# Patient Record
Sex: Male | Born: 1990 | Race: Black or African American | Hispanic: No | Marital: Single
Health system: Midwestern US, Community
[De-identification: ages and names within clinical notes are randomized; demographics above are authoritative.]

## PROBLEM LIST (undated history)

## (undated) DIAGNOSIS — M25571 Pain in right ankle and joints of right foot: Secondary | ICD-10-CM

## (undated) HISTORY — PX: EYE SURGERY: SHX253

---

## 2016-07-25 ENCOUNTER — Emergency Department (HOSPITAL_COMMUNITY)
Admission: EM | Admit: 2016-07-25 | Discharge: 2016-07-25 | Disposition: A | Discharge status: Home / Self Care | Payer: Self-pay | Attending: Emergency Medicine | Admitting: Emergency Medicine

## 2016-07-25 ENCOUNTER — Encounter (HOSPITAL_COMMUNITY): Payer: Self-pay | Admitting: Emergency Medicine

## 2016-07-25 ENCOUNTER — Emergency Department (HOSPITAL_COMMUNITY): Payer: Self-pay

## 2016-07-25 DIAGNOSIS — R55 Syncope and collapse: Secondary | ICD-10-CM | POA: Insufficient documentation

## 2016-07-25 LAB — BASIC METABOLIC PANEL
Anion gap: 7 (ref 5–15)
BUN: 6 mg/dL (ref 6–20)
CALCIUM: 9.4 mg/dL (ref 8.9–10.3)
CO2: 24 mmol/L (ref 22–32)
Chloride: 108 mmol/L (ref 101–111)
Creatinine, Ser: 1.04 mg/dL (ref 0.61–1.24)
GFR calc Af Amer: >60 mL/min (ref >60)
Glucose, Bld: 96 mg/dL (ref 65–99)
POTASSIUM: 4.3 mmol/L (ref 3.5–5.1)
SODIUM: 139 mmol/L (ref 135–145)

## 2016-07-25 LAB — CBC WITH DIFFERENTIAL/PLATELET
BASOS ABS: 0 10*3/uL (ref 0.0–0.1)
Basophils Relative: 0 %
EOS ABS: 0.1 10*3/uL (ref 0.0–0.7)
EOS PCT: 1 %
HCT: 43.9 % (ref 39.0–52.0)
Hemoglobin: 14.3 g/dL (ref 13.0–17.0)
Lymphocytes Relative: 20 %
Lymphs Abs: 1.6 10*3/uL (ref 0.7–4.0)
MCH: 29.6 pg (ref 26.0–34.0)
MCHC: 32.6 g/dL (ref 30.0–36.0)
MCV: 90.9 fL (ref 78.0–100.0)
MONO ABS: 0.7 10*3/uL (ref 0.1–1.0)
Monocytes Relative: 9 %
Neutro Abs: 5.5 10*3/uL (ref 1.7–7.7)
Neutrophils Relative %: 70 %
PLATELETS: 276 10*3/uL (ref 150–400)
RBC: 4.83 MIL/uL (ref 4.22–5.81)
RDW: 12.8 % (ref 11.5–15.5)
WBC: 7.9 10*3/uL (ref 4.0–10.5)

## 2016-07-25 NOTE — ED Provider Notes (Signed)
[facility]-EMERGENCY DEPT Provider Note   CSN: [id number] Arrival date & time: 07/25/16  1100     History   Chief Complaint Chief Complaint  Patient presents with  . Loss of Consciousness    HPI Mathew Potts is a 25 y.o. male.  HPI Patient presents emergency department after having a syncopal episode at work today.  He had a small nosebleed prior to that but it stopped on its own.  Did have a headache when he awoke this morning and has had headaches off and on for the last several weeks.  Is currently on no medication.  Did have preceding dizziness prior to passing out. History reviewed. No pertinent past medical history.  There are no active problems to display for this patient.   Past Surgical History:  Procedure Laterality Date  . 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 Authorizing Provider  diphenhydrAMINE (BENADRYL) 25 MG tablet Take 25 mg by mouth every 6 (six) hours as needed for allergies.   Yes Historical Provider, MD  guaifenesin (ROBITUSSIN) 100 MG/5ML syrup Take 200 mg by mouth 3 (three) times daily as needed for cough.   Yes Historical Provider, MD    Family History No family history on file.  Social History Social History  Substance Use Topics  . Smoking status: Never Smoker  . Smokeless tobacco: Never Used  . Alcohol use Yes     Comment: occasionally      Allergies   Review of patient's allergies indicates no known allergies.   Review of Systems Review of Systems  All other systems reviewed and are negative Physical Exam Updated Vital Signs BP 140/88   Pulse 67   Temp 99.4 F (37.4 C) (Oral)   Resp 15   Ht 5\' 7"  (1.702 m)   Wt 238 lb (108 kg)   SpO2 99%   BMI 37.28 kg/m   Physical Exam  Physical Exam  Nursing note and vitals reviewed. Constitutional: He is oriented to person, place, and time. He appears well-developed  and well-nourished. No distress.  HENT:  Head: Normocephalic and atraumatic.  Eyes: Pupils are equal, round, and reactive to light.  Neck: Normal range of motion.  Cardiovascular: Normal rate and intact distal pulses.   Pulmonary/Chest: No respiratory distress.  Abdominal: Normal appearance. He exhibits no distension.  Musculoskeletal: Normal range of motion.  Neurological: He is alert and oriented to person, place, and time. No cranial nerve deficit.  Skin: Skin is warm and dry. No rash noted.  Psychiatric: He has a normal mood and affect. His behavior is normal.   ED Treatments / Results  Labs (all labs ordered are listed, but only abnormal results are displayed) Labs Reviewed  CBC WITH DIFFERENTIAL/PLATELET  BASIC METABOLIC PANEL    EKG  EKG Interpretation None       Radiology Ct Head Wo Contrast  Result Date: 07/25/2016 CLINICAL DATA:  Pt fell today and hit the back left side of head, pt has been having a headache for the last few days on the right side, no other head injury or head trauma. EXAM: CT HEAD WITHOUT CONTRAST TECHNIQUE: Contiguous axial images were obtained from the base of the skull through the vertex without intravenous contrast. COMPARISON:  None. FINDINGS: Brain: There is no evidence of acute intracranial hemorrhage, mass lesion, brain edema or extra-axial fluid collection.  The ventricles and subarachnoid spaces are appropriately sized for age. There is no CT evidence of acute cortical infarction. Vascular: No hyperdense vessel or unexpected calcification. Skull: Negative for fracture or focal lesion. Sinuses/Orbits: The visualized paranasal sinuses and mastoid air cells are clear. No orbital abnormalities are seen. 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 IMPRESSION: Negative noncontrast head CT. Electronically Signed   By: Carey BullocksWilliam  Veazey M.D.   On: 07/25/2016 12:16    Procedures Procedures (including critical care time)  Medications  Ordered in ED Medications - No data to display   Initial Impression / Assessment and Plan / ED Course  I have reviewed the triage vital signs and the nursing notes.  Pertinent labs & imaging results that were available during my care of the patient were reviewed by me and considered in my medical decision making (see chart for details).  Clinical Course      Final Clinical Impressions(s) / ED Diagnoses   Final diagnoses:  Vasovagal syncope    New Prescriptions New Prescriptions   No medications on file     Nelva Nayobert Charlesa Ehle, MD 07/25/16 1338

## 2016-07-25 NOTE — ED Triage Notes (Signed)
Per EMS pt had a syncopal episode after a nosebleed, Pt states he was dizzy this morning and did not eat breakfast, just drank coffee and ate some candy. Pt had a headache this morning and started feeling dizzy, then noticed he had a nosebleed, felt more dizzy, passed out, and fell backwards. Witness says pt hit head on concrete.

## 2016-07-25 NOTE — ED Notes (Signed)
Pt returned from CT °

## 2016-07-31 ENCOUNTER — Encounter (HOSPITAL_COMMUNITY): Payer: Self-pay | Admitting: *Deleted

## 2016-07-31 ENCOUNTER — Emergency Department (HOSPITAL_COMMUNITY)
Admission: EM | Admit: 2016-07-31 | Discharge: 2016-07-31 | Disposition: A | Discharge status: Home / Self Care | Payer: Self-pay | Attending: Emergency Medicine | Admitting: Emergency Medicine

## 2016-07-31 ENCOUNTER — Emergency Department (HOSPITAL_COMMUNITY): Payer: Self-pay

## 2016-07-31 DIAGNOSIS — R519 Headache, unspecified: Secondary | ICD-10-CM

## 2016-07-31 DIAGNOSIS — R51 Headache: Secondary | ICD-10-CM | POA: Insufficient documentation

## 2016-07-31 MED ORDER — FEXOFENADINE-PSEUDOEPHED ER 60-120 MG PO TB12: 1.0000 | ORAL_TABLET | Freq: Two times a day (BID) | ORAL | 0 refills | Status: AC

## 2016-07-31 MED ORDER — KETOROLAC TROMETHAMINE 60 MG/2ML IM SOLN
60.0000 mg | Freq: Once | INTRAMUSCULAR | Status: AC
  Administered 2016-07-31: 60 mg via INTRAMUSCULAR
  Filled 2016-07-31: qty 2

## 2016-07-31 NOTE — ED Triage Notes (Addendum)
Patient presents to ED with c/o intermittent headache since his last ED visit on 9/29.  Patient was seen here on 9/29 and dx with vasovagal syncope.  Patient states pain is currently behind right eye and radiating into right temple.  Patient rates pain 2/10 currently and characterizes it as throbbing.  Patient has nasal congestion as well as a non-productive cough "for a long time."  Patient denies N/V/D,  fever and changes in vision.  Patient states he has had brief episodes of dizziness over the last week but is not currently dizzy.  Patient has treated nasal congestion with saline spray in the past, but when he moved to [place] last month, he left his spray behind.  Patient states he has been under a lot of stress lately because of a strained romantic relationship and his recent relocation.  Patient is ambulatory in triage with a steady gait and is in no distress.  Patient's only pertinent PMH is bilateral corneal transplant surgery at the age of 25.

## 2016-07-31 NOTE — ED Notes (Signed)
Bed: WA26 Expected date:  Expected time:  Means of arrival:  Comments: 

## 2016-07-31 NOTE — ED Provider Notes (Signed)
[facility]-EMERGENCY DEPT Provider Note   CSN: [id number] Arrival date & time: 07/31/16  1141  By signing my name below, I, Soijett Blue, attest that this documentation has been prepared under the direction and in the presence of Rolan Bucco, MD. Electronically Signed: Soijett Blue, ED Scribe. 07/31/16. 12:20 PM.   History   Chief Complaint Chief Complaint  Patient presents with  . Headache    HPI Mathew Potts is a 25 y.o. male who presents to the Emergency Department complaining of 2/10, waxing and waning, throbbing, right sided HA onset 1 month worsening today. Pt denies having headaches in the past. Pt states that he recently fell last week and struck his head, and he notes that he was evaluated in the ED. He states that he has tried ASA and anti-histamine with no relief of his symptoms. Pt denies taking an anti-inflammatory for relief of his HA. He denies nausea, vomiting, vision changes, fever, neck pain, numbness, tingling, weakness, nasal congestion, rhinorrhea, CP, SOB, abdominal pain, hematuria, constipation, diarrhea, photophobia, and any other symptoms. Denies having a PCP.   The history is provided by the patient. No language interpreter was used.    History reviewed. No pertinent past medical history.  There are no active problems to display for this patient.   Past Surgical History:  Procedure Laterality Date  . 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 Authorizing Provider  diphenhydrAMINE (BENADRYL) 25 MG tablet Take 25 mg by mouth every 6 (six) hours as needed for allergies.    Historical Provider, MD  fexofenadine-pseudoephedrine (ALLEGRA-D) 60-120 MG 12 hr tablet Take 1 tablet by mouth every 12 (twelve) hours. 07/31/16   Rolan Bucco, MD  guaifenesin (ROBITUSSIN) 100 MG/5ML syrup Take 200 mg by mouth 3 (three) times daily as needed for cough.    Historical  Provider, MD    Family History No family history on file.  Social History Social History  Substance Use Topics  . Smoking status: Never Smoker  . Smokeless tobacco: Never Used  . Alcohol use Yes     Comment: occasionally      Allergies   Review of patient's allergies indicates no known allergies.   Review of Systems Review of Systems  Constitutional: Negative for chills, diaphoresis, fatigue and fever.  HENT: Negative for congestion, rhinorrhea and sneezing.   Eyes: Negative.  Negative for photophobia and visual disturbance.  Respiratory: Negative for cough, chest tightness and shortness of breath.   Cardiovascular: Negative for chest pain and leg swelling.  Gastrointestinal: Negative for abdominal pain, blood in stool, constipation, diarrhea, nausea and vomiting.  Genitourinary: Negative for difficulty urinating, flank pain, frequency and hematuria.  Musculoskeletal: Negative for arthralgias, back pain and neck pain.  Skin: Negative for rash.  Neurological: Positive for headaches. Negative for dizziness, speech difficulty, weakness and numbness.       No tingling     Physical Exam Updated Vital Signs BP 142/76 (BP Location: Left Arm)   Pulse 80   Temp 98.1 F (36.7 C) (Oral)   Resp 17   Ht 5\' 7"  (1.702 m)   Wt 241 lb (109.3 kg)   SpO2 97%   BMI 37.75 kg/m   Physical Exam  Constitutional: He is oriented to person, place, and time. He appears well-developed and well-nourished.  HENT:  Head: Normocephalic and atraumatic.  Nose: Right sinus  exhibits no maxillary sinus tenderness and no frontal sinus tenderness. Left sinus exhibits no maxillary sinus tenderness and no frontal sinus tenderness.  Eyes: Pupils are equal, round, and reactive to light.  Neck: Normal range of motion. Neck supple.  Cardiovascular: Normal rate, regular rhythm and normal heart sounds.   Pulmonary/Chest: Effort normal and breath sounds normal. No respiratory distress. He has no wheezes. He  has no rales. He exhibits no tenderness.  Abdominal: Soft. Bowel sounds are normal. There is no tenderness. There is no rebound and no guarding.  Musculoskeletal: Normal range of motion. He exhibits no edema.  Lymphadenopathy:    He has no cervical adenopathy.  Neurological: He is alert and oriented to person, place, and time. He has normal strength. No cranial nerve deficit or sensory deficit. Coordination and gait normal.  Mental Status:  Alert, oriented, thought content appropriate, able to give a coherent history. Speech fluent without evidence of aphasia. Able to follow 2 step commands without difficulty.  Cranial Nerves:  II:  Peripheral visual fields grossly normal, pupils equal, round, reactive to light III,IV, VI: ptosis not present, extra-ocular motions intact bilaterally  V,VII: smile symmetric, facial light touch sensation equal VIII: hearing grossly normal to voice  X: uvula elevates symmetrically  XI: bilateral shoulder shrug symmetric and strong XII: midline tongue extension without fassiculations Motor:  Normal tone. 5/5 in upper and lower extremities bilaterally including strong and equal grip strength and dorsiflexion/plantar flexion Deep Tendon Reflexes: 2+ and symmetric in the biceps and patella Cerebellar: normal finger-to-nose with bilateral upper extremities Gait: normal gait and balance CV: distal pulses palpable throughout   Skin: Skin is warm and dry. No rash noted.  Psychiatric: He has a normal mood and affect.  Nursing note and vitals reviewed.    ED Treatments / Results  DIAGNOSTIC STUDIES: Oxygen Saturation is 97% on RA, nl by my interpretation.    COORDINATION OF CARE: 12:17 PM Discussed treatment plan with pt at bedside which includes CT head, toradol injection, and pt agreed to plan.   Radiology Ct Head Wo Contrast  Result Date: 07/31/2016 CLINICAL DATA:  25 year old male with intermittent headaches and a recent admission for vasovagal syncope.  EXAM: CT HEAD WITHOUT CONTRAST TECHNIQUE: Contiguous axial images were obtained from the base of the skull through the vertex without intravenous contrast. COMPARISON:  Prior CT scan of the head 07/25/2016 FINDINGS: Brain: No evidence of acute infarction, hemorrhage, hydrocephalus, extra-axial collection or mass lesion/mass effect. Vascular: No hyperdense vessel or unexpected calcification. Skull: Normal. Negative for fracture or focal lesion. Sinuses/Orbits: No acute finding. 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 IMPRESSION: Negative head CT. Electronically Signed   By: Malachy Moan M.D.   On: 07/31/2016 12:59    Procedures Procedures (including critical care time)  Medications Ordered in ED Medications  ketorolac (TORADOL) injection 60 mg (60 mg Intramuscular Given 07/31/16 1227)     Initial Impression / Assessment and Plan / ED Course  I have reviewed the triage vital signs and the nursing notes.  Pertinent imaging results that were available during my care of the patient were reviewed by me and considered in my medical decision making (see chart for details).  Clinical Course    Patient presents with a headache. He is neurologically intact. Given that they've been going on for over a month consistently, I did obtain a head CT which was negative. There is no mass. He doesn't have any suggestions of subarachnoid  hemorrhage or meningitis. His headache is mild at this point. He was given a shot of Toradol and is essentially gone. This could be related to his allergies with sinus congestion. There is no evidence of sinusitis. He was given a prescription for a Allegra-D. He was encouraged to establish care with a PCP or return here as needed for any worsening symptoms.  Final Clinical Impressions(s) / ED Diagnoses   Final diagnoses:  Headache disorder    New Prescriptions New Prescriptions   FEXOFENADINE-PSEUDOEPHEDRINE (ALLEGRA-D) 60-120 MG  12 HR TABLET    Take 1 tablet by mouth every 12 (twelve) hours.    I personally performed the services described in this documentation, which was scribed in my presence.  The recorded information has been reviewed and considered.     Rolan BuccoMelanie Shalandra Leu, MD 07/31/16 [phone]

## 2018-05-17 IMAGING — CT CT HEAD W/O CM
3 of 4 series · 18 of 47 positions shown, 21 images · non-contrast
Comparison: None.

CLINICAL DATA: Pt fell today and hit the back left side of head, pt
has been having a headache for the last few days on the right side,
no other head injury or head trauma.

EXAM:
CT HEAD WITHOUT CONTRAST
TECHNIQUE: Contiguous axial images were obtained from the base of the skull
through the vertex without intravenous contrast.

[Series 201: head w/o, idose (1) · axial · non-contrast · 0.42mm/px · z∈[+941,+1066]mm · 12 of 31 slices shown, 15 images]
[im 3/31  brain]
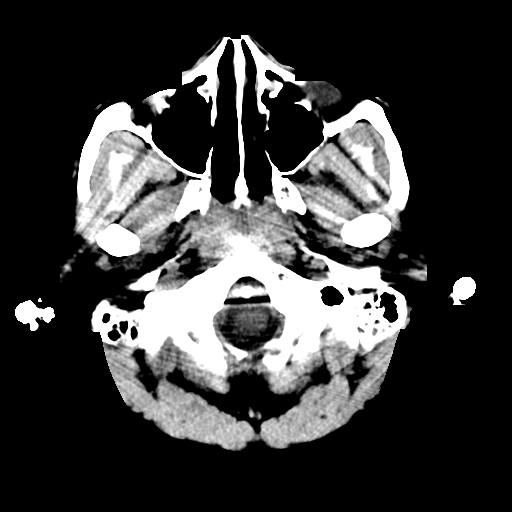
[im 3/31  bone]
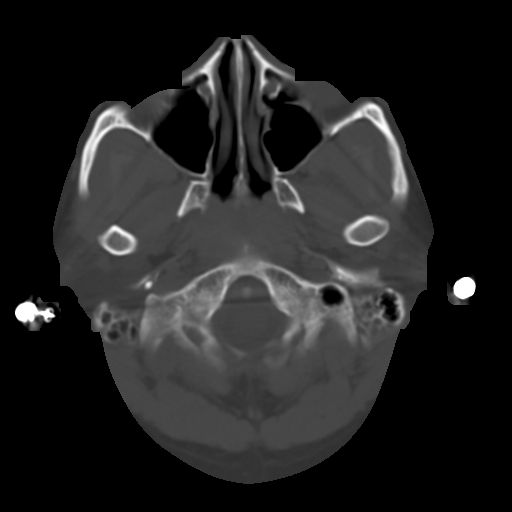
[im 5/31  brain]
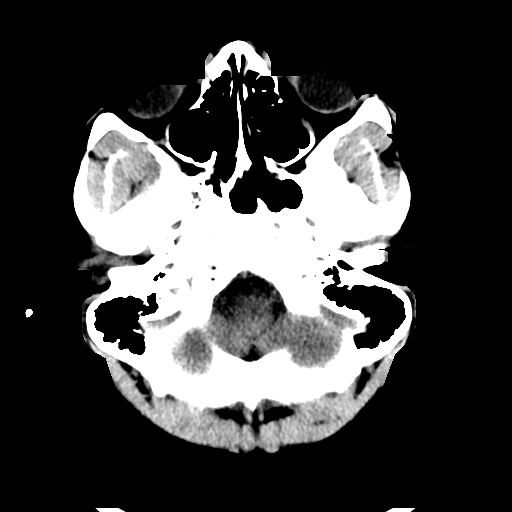
[im 7/31  brain]
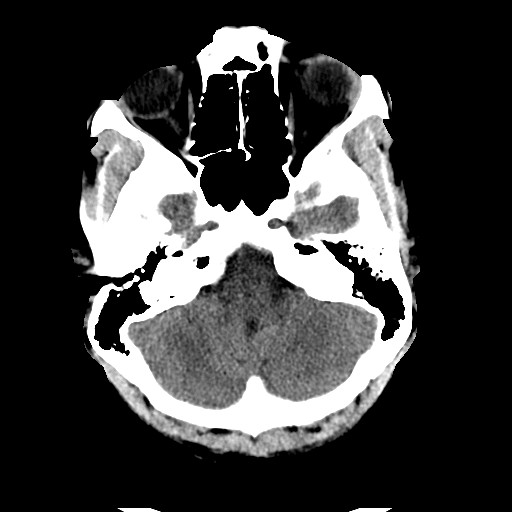
[im 9/31  brain]
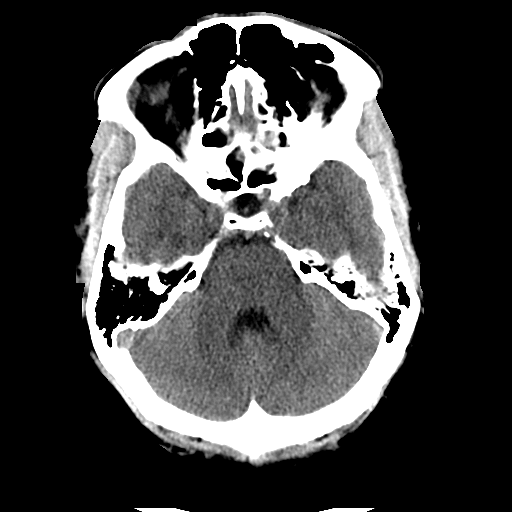
[im 11/31  brain]
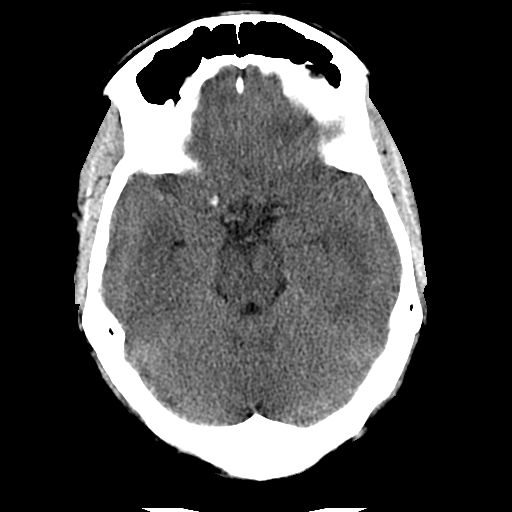
[im 11/31  bone]
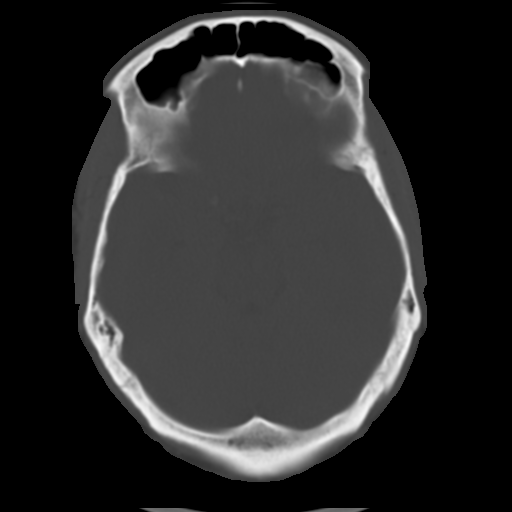
[im 13/31  brain]
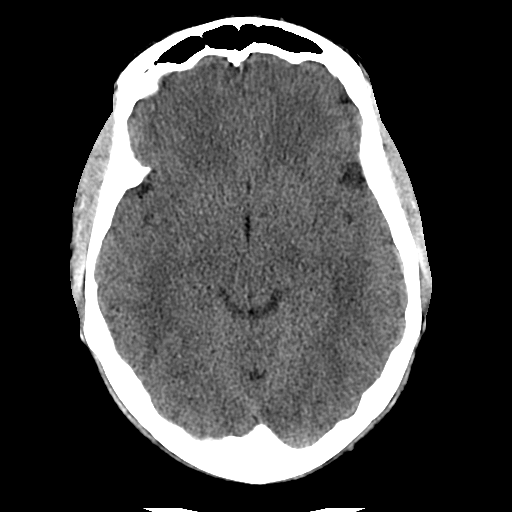
[im 18/31  brain]
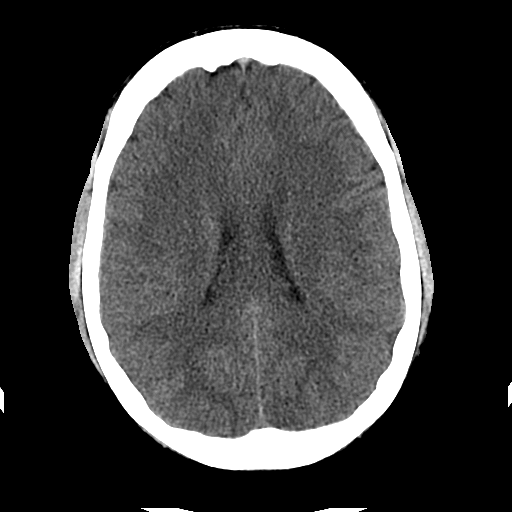
[im 20/31  brain]
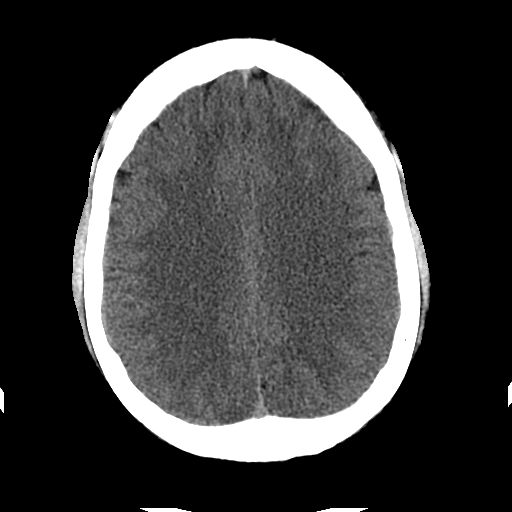
[im 22/31  brain]
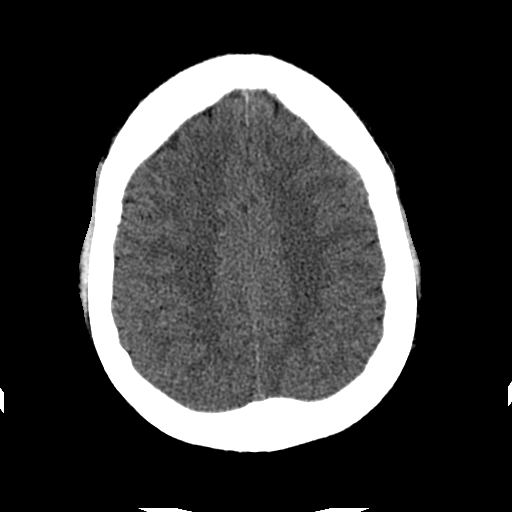
[im 22/31  bone]
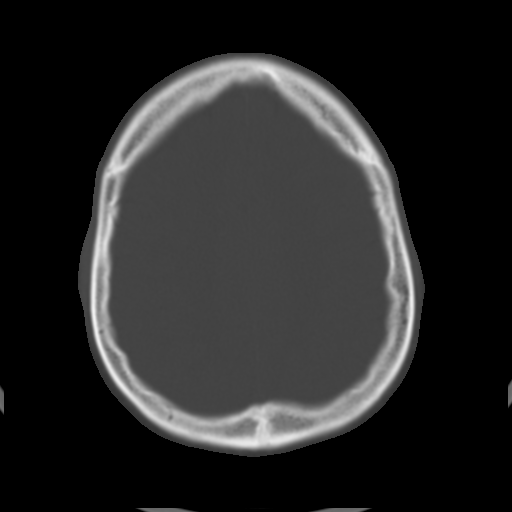
[im 24/31  brain]
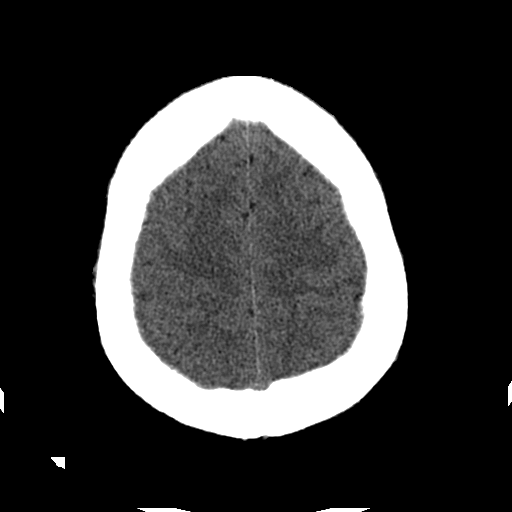
[im 26/31  brain]
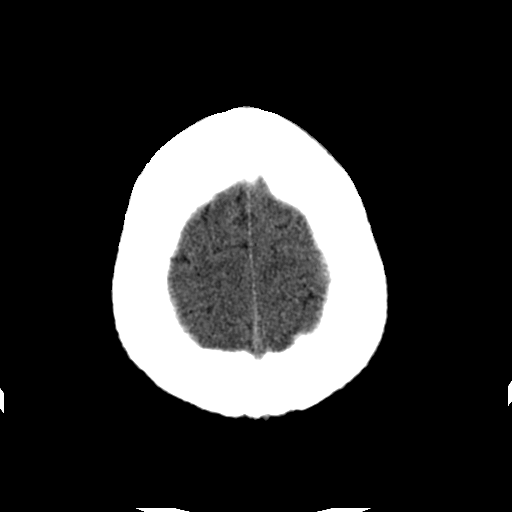
[im 28/31  brain]
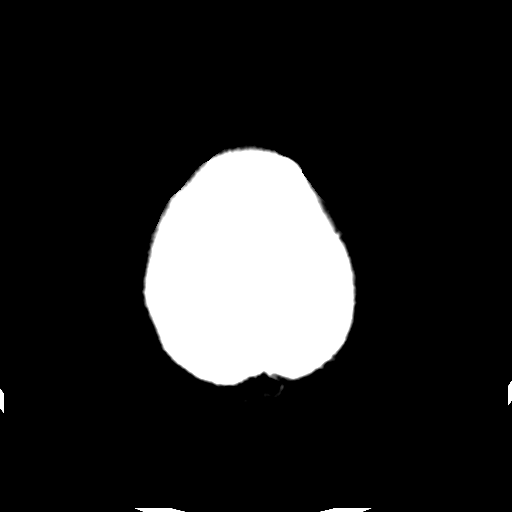

[Series 203: coronal st, idose (1) · coronal · 0.40mm/px · 3 of 71 slices shown]
[im 24/71  brain]
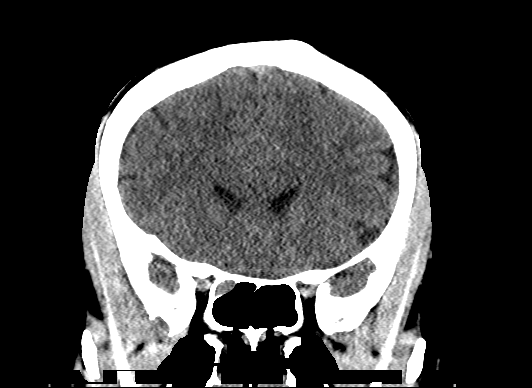
[im 32/71  brain]
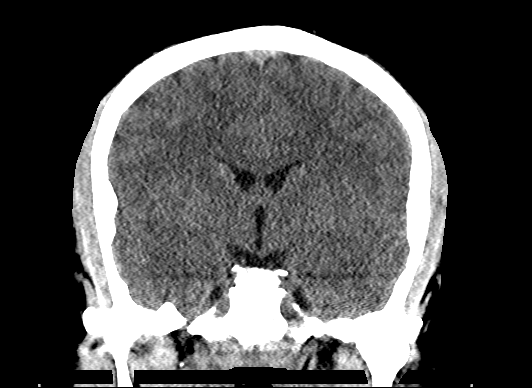
[im 39/71  brain]
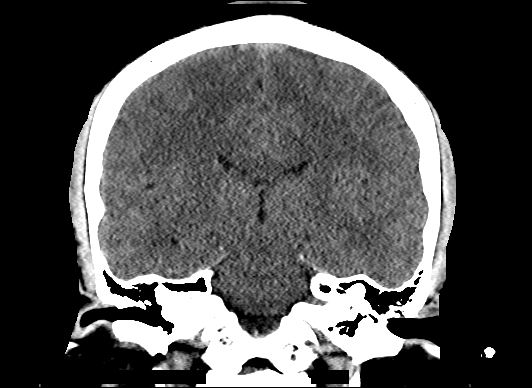

[Series 204: sagittal st, idose (1) · sagittal · 0.40mm/px · 3 of 71 slices shown]
[im 24/71  brain]
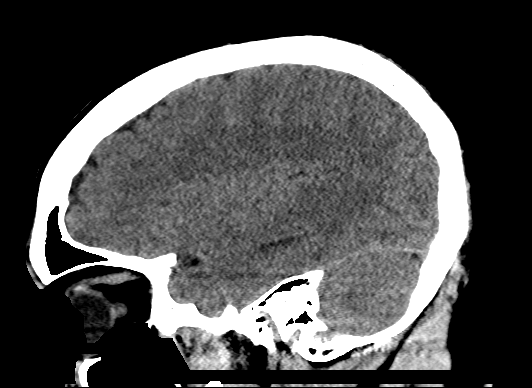
[im 36/71  brain]
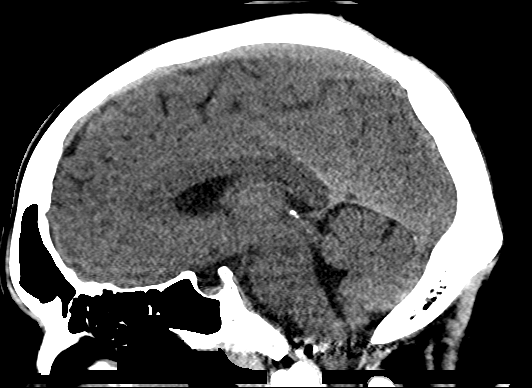
[im 47/71  brain]
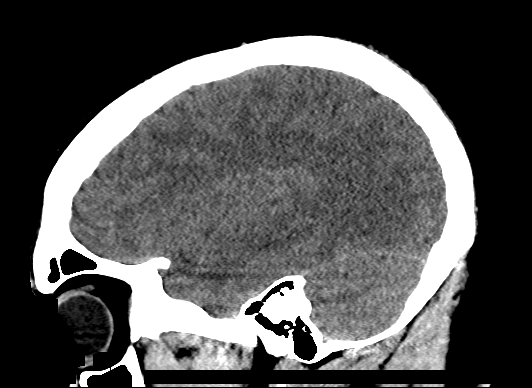

[18 of 47 positions shown; findings below may reference images not displayed]

FINDINGS: Brain: There is no evidence of acute intracranial hemorrhage, mass
lesion, brain edema or extra-axial fluid collection. The ventricles
and subarachnoid spaces are appropriately sized for age. There is no
CT evidence of acute cortical infarction.

Vascular: No hyperdense vessel or unexpected calcification.

Skull: Negative for fracture or focal lesion.

Sinuses/Orbits: The visualized paranasal sinuses and mastoid air
cells are clear. No orbital abnormalities are seen.

Other: Incomplete posterior arch of C1 noted incidentally.
IMPRESSION: Negative noncontrast head CT.

## 2020-12-28 ENCOUNTER — Inpatient Hospital Stay
Admit: 2020-12-28 | Discharge: 2020-12-28 | Disposition: A | Discharge status: Home / Self Care | Payer: BLUE CROSS/BLUE SHIELD | Attending: Emergency Medicine

## 2020-12-28 ENCOUNTER — Emergency Department: Admit: 2020-12-28 | Payer: BLUE CROSS/BLUE SHIELD

## 2020-12-28 DIAGNOSIS — R0789 Other chest pain: Secondary | ICD-10-CM

## 2020-12-28 LAB — COMPREHENSIVE METABOLIC PANEL
ALT: 55 U/L (ref 12–78)
AST: 25 U/L (ref 15–37)
Albumin/Globulin Ratio: 1 — ABNORMAL LOW (ref 1.1–2.2)
Albumin: 3.8 g/dL (ref 3.5–5.0)
Alkaline Phosphatase: 92 U/L (ref 45–117)
Anion Gap: 11 mmol/L (ref 5–15)
BUN: 9 mg/dL (ref 6–20)
Bun/Cre Ratio: 9 — ABNORMAL LOW (ref 12–20)
CO2: 24 mmol/L (ref 21–32)
Calcium: 8.7 mg/dL (ref 8.5–10.1)
Chloride: 105 mmol/L (ref 97–108)
Creatinine: 1.05 mg/dL (ref 0.70–1.30)
EGFR IF NonAfrican American: >60 mL/min/{1.73_m2} (ref >60)
GFR African American: >60 mL/min/{1.73_m2} (ref >60)
Globulin: 4 g/dL (ref 2.0–4.0)
Glucose: 92 mg/dL (ref 65–100)
Potassium: 3.9 mmol/L (ref 3.5–5.1)
Sodium: 140 mmol/L (ref 136–145)
Total Bilirubin: 0.6 mg/dL (ref 0.2–1.0)
Total Protein: 7.8 g/dL (ref 6.4–8.2)

## 2020-12-28 LAB — PROTIME-INR
INR: 1.1 (ref 0.9–1.1)
Protime: 13.2 s (ref 11.9–14.6)

## 2020-12-28 LAB — CBC WITH AUTO DIFFERENTIAL
Basophils %: 1 % (ref 0.0–2.5)
Basophils Absolute: 0 10*3/uL (ref 0.0–0.2)
Eosinophils %: 1 % (ref 0.9–2.9)
Eosinophils Absolute: 0.1 10*3/uL (ref 0.0–0.7)
Hematocrit: 40.7 % — ABNORMAL LOW (ref 41–53)
Hemoglobin: 13.5 g/dL (ref 13.5–17.5)
Lymphocytes %: 14 % — ABNORMAL LOW (ref 20.5–51.1)
Lymphocytes Absolute: 1.2 10*3/uL (ref 1.0–4.8)
MCH: 29.4 PG — ABNORMAL LOW (ref 31–34)
MCHC: 33.2 g/dL (ref 31.0–36.0)
MCV: 88.6 FL (ref 80–100)
MPV: 8.6 FL (ref 6.5–11.5)
Monocytes %: 11 % — ABNORMAL HIGH (ref 1.7–9.3)
Monocytes Absolute: 0.9 10*3/uL (ref 0.2–2.4)
NRBC Absolute: 0 10*3/uL
Neutrophils %: 73 % (ref 42–75)
Neutrophils Absolute: 6 10*3/uL (ref 1.8–7.7)
Nucleated RBCs: 0.1 PER 100 WBC
Platelets: 279 10*3/uL (ref 150–400)
RBC: 4.59 M/uL (ref 4.50–5.90)
RDW: 13.5 % (ref 11.5–14.5)
WBC: 8.2 10*3/uL (ref 4.4–11.3)

## 2020-12-28 LAB — MAGNESIUM
Magnesium: 2.2 mg/dL (ref 1.6–2.4)
Magnesium: 2.2 mg/dL (ref 1.6–2.4)

## 2020-12-28 LAB — TROPONIN, HIGH SENSITIVITY: Troponin, High Sensitivity: 5 ng/L (ref 0–76)

## 2020-12-28 LAB — CBC WITH AUTOMATED DIFF
ABS. BASOPHILS: 0 10*3/uL (ref 0.0–0.2)
ABS. EOSINOPHILS: 0.1 10*3/uL (ref 0.0–0.7)
ABS. LYMPHOCYTES: 1.2 10*3/uL (ref 1.0–4.8)
ABS. MONOCYTES: 0.9 10*3/uL (ref 0.2–2.4)
ABS. NEUTROPHILS: 6 10*3/uL (ref 1.8–7.7)
ABSOLUTE NRBC: 0 10*3/uL
BASOPHILS: 1 % (ref 0.0–2.5)
EOSINOPHILS: 1 % (ref 0.9–2.9)
HCT: 40.7 % — ABNORMAL LOW (ref 41–53)
HGB: 13.5 g/dL (ref 13.5–17.5)
LYMPHOCYTES: 14 % — ABNORMAL LOW (ref 20.5–51.1)
MCH: 29.4 PG — ABNORMAL LOW (ref 31–34)
MCHC: 33.2 g/dL (ref 31.0–36.0)
MCV: 88.6 FL (ref 80–100)
MONOCYTES: 11 % — ABNORMAL HIGH (ref 1.7–9.3)
MPV: 8.6 FL (ref 6.5–11.5)
NEUTROPHILS: 73 % (ref 42–75)
NRBC: 0.1 PER 100 WBC
PLATELET: 279 10*3/uL (ref 150–400)
RBC: 4.59 M/uL (ref 4.50–5.90)
RDW: 13.5 % (ref 11.5–14.5)
WBC: 8.2 10*3/uL (ref 4.4–11.3)

## 2020-12-28 LAB — METABOLIC PANEL, COMPREHENSIVE
A-G Ratio: 1 — ABNORMAL LOW (ref 1.1–2.2)
ALT (SGPT): 55 U/L (ref 12–78)
AST (SGOT): 25 U/L (ref 15–37)
Albumin: 3.8 g/dL (ref 3.5–5.0)
Alk. phosphatase: 92 U/L (ref 45–117)
Anion gap: 11 mmol/L (ref 5–15)
BUN/Creatinine ratio: 9 — ABNORMAL LOW (ref 12–20)
BUN: 9 mg/dL (ref 6–20)
Bilirubin, total: 0.6 mg/dL (ref 0.2–1.0)
CO2: 24 mmol/L (ref 21–32)
Calcium: 8.7 mg/dL (ref 8.5–10.1)
Chloride: 105 mmol/L (ref 97–108)
Creatinine: 1.05 mg/dL (ref 0.70–1.30)
GFR est AA: >60 mL/min/{1.73_m2} (ref >60)
GFR est non-AA: >60 mL/min/{1.73_m2} (ref >60)
Globulin: 4 g/dL (ref 2.0–4.0)
Glucose: 92 mg/dL (ref 65–100)
Potassium: 3.9 mmol/L (ref 3.5–5.1)
Protein, total: 7.8 g/dL (ref 6.4–8.2)
Sodium: 140 mmol/L (ref 136–145)

## 2020-12-28 LAB — TROPONIN-HIGH SENSITIVITY: Troponin-High Sensitivity: 5 ng/L (ref 0–76)

## 2020-12-28 LAB — PROTHROMBIN TIME + INR
INR: 1.1 (ref 0.9–1.1)
Prothrombin time: 13.2 s (ref 11.9–14.6)

## 2020-12-28 MED ORDER — IBUPROFEN 800 MG TAB
800 mg | ORAL | Status: AC
Start: 2020-12-28 — End: 2020-12-28
  Administered 2020-12-28: 22:00:00 via ORAL

## 2020-12-28 MED FILL — IBUPROFEN 800 MG TAB: 800 mg | ORAL | Qty: 1

## 2020-12-28 NOTE — ED Notes (Signed)
Pt reports receiving covid vaccine on 12/26/2020 and is now reporting right sided chest, shoulder, and head pain. Pt describes pain as sharp and sore.

## 2020-12-28 NOTE — ED Triage Notes (Signed)
Formatting of this note might be different from the original.  Pt reports receiving covid vaccine on 12/26/2020 and is now reporting right sided chest, shoulder, and head pain. Pt describes pain as sharp and sore.   Electronically signed by Shann Medal at 12/28/2020  3:12 PM EST

## 2020-12-28 NOTE — ED Provider Notes (Signed)
Formatting of this note is different from the original.  Images from the original note were not included.  EMERGENCY DEPARTMENT HISTORY AND PHYSICAL EXAM    Date: 12/28/2020  Patient Name: Frank Arnold    History of Presenting Illness     Chief Complaint   Patient presents with   ? Chest Pain     History Provided By: Patient    HPI: Frank PaulsCameron Dubinsky, 30 y.o. male with a past medical history significant presents to the ED with cc of right sided.chest pain while at work this morning. He denied SOB but is coughing non-productive sputum.  No fever or chills, recent trauma.  Patient denies smoking cigarette or alcohol.  No prior hx of chest pain.    There are no other complaints, changes, or physical findings at this time.    PCP: No primary care provider on file.    No current facility-administered medications on file prior to encounter.     No current outpatient medications on file prior to encounter.     Past History     Past Medical History:  No past medical history on file.    Past Surgical History:  No past surgical history on file.    Family History:  No family history on file.    Social History:  Social History     Tobacco Use   ? Smoking status: Not on file   ? Smokeless tobacco: Not on file   Substance Use Topics   ? Alcohol use: Not on file   ? Drug use: Not on file     Allergies:  Not on File    Review of Systems     Review of Systems   Constitutional: Negative.    HENT: Negative.    Eyes: Negative.    Respiratory: Positive for cough. Negative for chest tightness, shortness of breath and wheezing.    Cardiovascular: Positive for chest pain.   Gastrointestinal: Negative.    Endocrine: Negative.    Genitourinary: Negative.    Musculoskeletal: Negative.    Skin: Negative.    Allergic/Immunologic: Negative.    Neurological: Negative.    Hematological: Negative.    Psychiatric/Behavioral: Negative.    All other systems reviewed and are negative.    Physical Exam     Physical Exam  Vitals and nursing note reviewed.    Constitutional:       Appearance: Normal appearance.   HENT:      Head: Normocephalic.      Nose: Nose normal.      Mouth/Throat:      Mouth: Mucous membranes are moist.   Eyes:      Pupils: Pupils are equal, round, and reactive to light.   Cardiovascular:      Rate and Rhythm: Normal rate and regular rhythm.   Pulmonary:      Effort: Pulmonary effort is normal.      Breath sounds: Normal breath sounds. No decreased breath sounds, wheezing, rhonchi or rales.   Chest:      Chest wall: No mass or tenderness.   Abdominal:      General: Abdomen is flat. Bowel sounds are normal. There is no abdominal bruit.      Palpations: Abdomen is soft. There is no fluid wave, hepatomegaly, splenomegaly or mass.      Tenderness: There is no abdominal tenderness. There is no guarding or rebound.   Musculoskeletal:         General: Tenderness present. Normal range of motion.  Cervical back: Normal range of motion.      Comments: Tenderness on the right side   Skin:     General: Skin is warm.      Coloration: Skin is not pale.      Findings: No erythema.      Nails: There is no clubbing.   Neurological:      General: No focal deficit present.      Mental Status: He is alert and oriented to person, place, and time.      Motor: No weakness.   Psychiatric:         Mood and Affect: Mood normal.     Lab and Diagnostic Study Results     Labs -   No results found for this or any previous visit (from the past 12 hour(s)).    Radiologic Studies -   @lastxrresult @  CT Results  (Last 48 hours)    None       CXR Results  (Last 48 hours)    None       Medical Decision Making   - I am the first provider for this patient.    - I reviewed the vital signs, available nursing notes, past medical history, past surgical history, family history and social history.    - Initial assessment performed. The patients presenting problems have been discussed, and they are in agreement with the care plan formulated and outlined with them.  I have encouraged  them to ask questions as they arise throughout their visit.    Vital Signs-Reviewed the patient's vital signs.  No data found.    Records Reviewed: Nursing Notes    The patient presents with chest pain with a differential diagnosis of  ACS, arrhythmia, acute MI, pulmonary edema/CHF, angina, aortic dissection, bronchitis, chest wall pain, costochondritis, dyspnea, GERD, pericarditis, pnuemothorax and pnuemonia    ED Course:         Provider Notes (Medical Decision Making):     MDM     Procedures   Medical Decision Makingedical Decision Making  Performed by: Matthias Hughs, MD  PROCEDURES:Procedures     Disposition   Disposition: Condition stable  DC- Adult Discharges: All of the diagnostic tests were reviewed and questions answered. Diagnosis, care plan and treatment options were discussed.  The patient understands the instructions and will follow up as directed. The patients results have been reviewed with them.  They have been counseled regarding their diagnosis.  The patient verbally convey understanding and agreement of the signs, symptoms, diagnosis, treatment and prognosis and additionally agrees to follow up as recommended with their PCP in 24 - 48 hours.  They also agree with the care-plan and convey that all of their questions have been answered.  I have also put together some discharge instructions for them that include: 1) educational information regarding their diagnosis, 2) how to care for their diagnosis at home, as well a 3) list of reasons why they would want to return to the ED prior to their follow-up appointment, should their condition change.    DISCHARGE PLAN:  1. There are no discharge medications for this patient.    2.   Follow-up Information    None      3.  Return to ED if worse   4. There are no discharge medications for this patient.    Diagnosis     Clinical Impression:     ICD-10-CM ICD-9-CM    1. Atypical chest pain  R07.89 786.59  Attestations:    Matthias Hughs, MD    Please  note that this dictation was completed with Dragon, the computer voice recognition software.  Quite often unanticipated grammatical, syntax, homophones, and other interpretive errors are inadvertently transcribed by the computer software.  Please disregard these errors.  Please excuse any errors that have escaped final proofreading.  Thank you.      Electronically signed by Matthias Hughs, MD at 12/28/2020  5:10 PM EST

## 2020-12-28 NOTE — ED Provider Notes (Signed)
ED Provider Notes by Matthias Hughs, MD at 12/28/20 1511                Author: Matthias Hughs, MD  Service: EMERGENCY  Author Type: Physician       Filed: 12/28/20 1710  Date of Service: 12/28/20 1511  Status: Addendum          Editor: Matthias Hughs, MD (Physician)          Related Notes: Original Note by Matthias Hughs, MD (Physician) filed at 12/28/20 1708               EMERGENCY DEPARTMENT HISTORY AND PHYSICAL EXAM           Date: 12/28/2020   Patient Name: Frank Arnold        History of Presenting Illness          Chief Complaint       Patient presents with        ?  Chest Pain           History Provided By: Patient      HPI: Stephen Baruch,  30 y.o. male with a past medical history significant presents to the ED with cc of right  sided.chest pain while at work this morning. He denied SOB but is coughing non-productive sputum.  No fever or chills, recent trauma.  Patient denies smoking cigarette or alcohol.  No prior hx of chest pain.      There are no other complaints, changes, or physical findings at this time.      PCP: No primary care provider on file.        No current facility-administered medications on file prior to encounter.          No current outpatient medications on file prior to encounter.             Past History        Past Medical History:   No past medical history on file.      Past Surgical History:   No past surgical history on file.      Family History:   No family history on file.      Social History:     Social History          Tobacco Use         ?  Smoking status:  Not on file     ?  Smokeless tobacco:  Not on file       Substance Use Topics         ?  Alcohol use:  Not on file         ?  Drug use:  Not on file           Allergies:   Not on File           Review of Systems        Review of Systems    Constitutional: Negative.     HENT: Negative.     Eyes: Negative.     Respiratory: Positive for cough. Negative for chest tightness, shortness of breath and wheezing.      Cardiovascular: Positive for chest pain.    Gastrointestinal: Negative.     Endocrine: Negative.     Genitourinary: Negative.     Musculoskeletal: Negative.     Skin: Negative.     Allergic/Immunologic: Negative.     Neurological: Negative.     Hematological: Negative.     Psychiatric/Behavioral:  Negative.     All other systems reviewed and are negative.           Physical Exam        Physical Exam   Vitals and nursing note reviewed.   Constitutional:        Appearance: Normal appearance.   HENT :       Head: Normocephalic.      Nose: Nose normal.      Mouth/Throat:      Mouth: Mucous membranes are moist.    Eyes:       Pupils: Pupils are equal, round, and reactive to light.   Cardiovascular:       Rate and Rhythm: Normal rate and regular rhythm.   Pulmonary :       Effort: Pulmonary effort is normal.      Breath sounds: Normal breath sounds. No decreased breath sounds, wheezing, rhonchi or rales.   Chest:       Chest wall: No mass or tenderness.   Abdominal :      Michiko Lineman: Abdomen is flat. Bowel sounds are normal. There is no abdominal bruit.      Palpations: Abdomen is soft. There is no fluid wave, hepatomegaly, splenomegaly or mass.      Tenderness: There is no abdominal tenderness. There is  no guarding or rebound.     Musculoskeletal:          Nealie Mchatton: Tenderness present. Normal range of motion.      Cervical back: Normal range of motion.      Comments:  Tenderness on the right side    Skin:      Jillian Pianka: Skin is warm.      Coloration: Skin is not pale.      Findings: No erythema.      Nails: There is no clubbing.    Neurological:       Cristel Rail: No focal deficit present.      Mental Status: He is alert and oriented to person, place, and time.      Motor: No weakness.   Psychiatric:         Mood and Affect: Mood normal.               Lab and Diagnostic Study Results        Labs -    No results found for this or any previous visit (from the past 12 hour(s)).      Radiologic Studies -    @lastxrresult @     CT  Results   (Last 48 hours)          None                 CXR Results   (Last 48 hours)          None                       Medical Decision Making     - I am the first provider for this patient.      - I reviewed the vital signs, available nursing notes, past medical history, past surgical history, family history and social history.      - Initial assessment performed. The patients presenting problems have been discussed, and they are in agreement with the care plan formulated and outlined with them.  I have encouraged them to ask questions as they arise throughout their visit.      Vital Signs-Reviewed the patient's  vital signs.   No data found.      Records Reviewed: Nursing Notes      The patient presents with chest pain with a differential diagnosis of  ACS, arrhythmia, acute MI, pulmonary edema/CHF, angina, aortic dissection, bronchitis, chest wall pain, costochondritis,  dyspnea, GERD, pericarditis, pnuemothorax and pnuemonia         ED Course:              Provider Notes (Medical Decision Making):       MDM            Procedures     Medical Decision Makingedical Decision Making   Performed by: Matthias Hughs, MD   PROCEDURES:Procedures            Disposition     Disposition: Condition stable   DC- Adult Discharges: All of the diagnostic tests were reviewed and questions answered. Diagnosis, care plan and treatment options were discussed.  The patient understands the instructions and will follow up as directed. The patients results have been  reviewed with them.  They have been counseled regarding their diagnosis.  The patient verbally convey understanding and agreement of the signs, symptoms, diagnosis, treatment and prognosis and additionally agrees to follow up as recommended with their  PCP in 24 - 48 hours.  They also agree with the care-plan and convey that all of their questions have been answered.  I have also put together some discharge instructions for them that include: 1) educational information  regarding their diagnosis, 2)  how to care for their diagnosis at home, as well a 3) list of reasons why they would want to return to the ED prior to their follow-up appointment, should their condition change.            DISCHARGE PLAN:   1. There are no discharge medications for this patient.      2.      Follow-up Information      None             3.  Return to ED if worse    4. There are no discharge medications for this patient.              Diagnosis        Clinical Impression:              ICD-10-CM  ICD-9-CM             1.  Atypical chest pain   R07.89  786.59       Attestations:      Matthias Hughs, MD      Please note that this dictation was completed with Dragon, the computer voice recognition software.  Quite often unanticipated grammatical, syntax, homophones, and other interpretive errors are inadvertently  transcribed by the computer software.  Please disregard these errors.  Please excuse any errors that have escaped final proofreading.  Thank you.

## 2020-12-31 LAB — EKG 12-LEAD
Atrial Rate: 77 {beats}/min
P Axis: 53 degrees
P-R Interval: 145 ms
Q-T Interval: 362 ms
QRS Duration: 94 ms
QTc Calculation (Bazett): 408 ms
R Axis: 39 degrees
T Axis: 33 degrees
Ventricular Rate: 76 {beats}/min

## 2020-12-31 LAB — EKG, 12 LEAD, INITIAL
Atrial Rate: 77 {beats}/min
Calculated P Axis: 53 degrees
Calculated R Axis: 39 degrees
Calculated T Axis: 33 degrees
P-R Interval: 145 ms
Q-T Interval: 362 ms
QRS Duration: 94 ms
QTC Calculation (Bezet): 408 ms
Ventricular Rate: 76 {beats}/min

## 2021-02-19 NOTE — Progress Notes (Signed)
Formatting of this note is different from the original.  Images from the original note were not included.    [facility]  [address]  [place], [place] [address]  Phone: [phone] / Fax: [phone]    02/19/2021  2:20 PM     (This note is a companion to clinical staff notes. History of present illness, past histories, review of systems and other documentation by clinical staff have been reviewed with patient. Updates are recorded in the relevant chart sections and/or noted below.)    Frank Arnold 1991/09/22     Chief Complaint:  Chief Complaint   Patient presents with   ? Headache     Hands are swollen and nosebleed this morning, migraine started last night, took Tylenol last night and it did not help, has not taken anything today     Allergies:  No Known Allergies      Subjective:  Review of Systems   Neurological: Positive for headaches.     Objective:  BP 132/87 (BP Site: Right arm, Patient Position: Sitting, Cuff Size: Large Adult)   Pulse 87   Temp 98.1 F (36.7 C) (Oral)   Resp 17   Ht 5\' 7"  (1.702 m)   Wt 254 lb (115.2 kg)   SpO2 96%   BMI 39.78 kg/m      Physical Exam  Constitutional:       Appearance: Normal appearance.   HENT:      Head: Normocephalic.      Right Ear: A middle ear effusion is present.      Left Ear: A middle ear effusion is present.      Nose: Nose normal.      Right Turbinates: Enlarged and swollen.      Left Turbinates: Enlarged and swollen.      Mouth/Throat:      Mouth: Mucous membranes are moist.      Pharynx: Oropharynx is clear.   Eyes:      General: Lids are normal.      Extraocular Movements:      Right eye: No nystagmus.      Left eye: No nystagmus.      Pupils: Pupils are equal, round, and reactive to light.      Slit lamp exam:     Right eye: No photophobia.      Left eye: No photophobia.   Cardiovascular:      Rate and Rhythm: Normal rate and regular rhythm.      Heart sounds: Normal heart sounds.   Pulmonary:      Effort: Pulmonary effort is  normal.      Breath sounds: Normal breath sounds.   Musculoskeletal:      Cervical back: Normal range of motion.   Lymphadenopathy:      Cervical: No cervical adenopathy.   Skin:     General: Skin is warm and dry.   Neurological:      General: No focal deficit present.      Mental Status: He is alert.      Gait: Gait normal.     Diagnosis/Orders:    1. Allergic rhinitis due to other allergic trigger, unspecified seasonality  - cetirizine (ZyrTEC) 10 mg Oral Tablet; Take 1 Tablet by mouth at bedtime for 30 days.  Dispense: 30 Tablet; Refill: 0    2. Tension headache  - ketorolac (TORADOL) IM injection 60 mg    Assessment/Plan:   Pt has hx of headache  disorder several years ago, was not put on medication at that time. Headache started yesterday, took Tylenol yesterday and has taken Ibuprofen today without relief of symptoms. Has hx of seasonal allergies, not on meds.  Denies blurred vision, dizziness, photophobia, nausea.  Toradol injection given while in clinic for headache tx with allergy meds. Recommended to establish care with PCP      Patient Instructions     What Are Migraine and Tension Headaches?    Although there are several types of headaches, migraine and tension headaches affect the most people. When you have a headache, it isn't your brain that's hurting. Your head aches because nerves in the bones, blood vessels, meninges, and muscles of your head are irritated. These irritated nerves send pain signals to the brain, which identifies where you hurt and how bad the pain is.  Talk with your healthcare provider about a treatment plan that may help relieve pain and prevent future headaches.   What causes your headache?  The actual headache process is not yet understood.Only rarely are headaches a sign of a serious medical problem such as a tumor. Headache pain may be caused by abnormal interaction between the brain and the nerves and blood vessels in the head. A previous head injury or concussion, neck pain,  environmental stresses, muscle tension, anxiety, depression, fatigue, skipping meals, or certain foods and drinks may trigger headache pain.  Brain scans are rarely needed and only for certain danger sign symptoms. CT scans are associated with potential radiation effects and potential inaccurate false findings.  What is referred pain?  Headache pain can be referred pain, which ispain that has its source in one place but is felt in another. For example, pain behind the eyes may actually be caused by tense muscles in the neck and shoulders. This means that the place that hurts may not be the part of the body that needs treatment.  Is it a migraine?  Migraine is a vascular headache that causes throbbing pain felt on one (most common) or both sides (less common) of the head. You may feel nauseated or vomit. This headache may also be preceded or associated with changes in sight (like seeing spots or flashes of light), ability to speak, or sensation (aura). There are a wide variety of environmental and food-related triggers for migraines. The pain may last for 4 to 72 hours. Afterward, you may feel shaky for a day or so. If this is the first time you experience these symptoms, you should immediately seek medical attention becauseyou could be having a stroke.  Is it a tension headache?  This type of headache is usually a dull ache or a sensation of pressure on both sides of the head. It may be associated with pain or tension in the neck and shoulders. Depression, anxiety, and stress can cause a tension headache. The pain may not have a definite beginning or end. It may come and go, or seem never to go away.  When to call the healthcare provider  Call yourhealthcare providerfor headaches thathappen along with any of these symptoms:   Sudden, severe headache that is different from your usual headache pain   Headache associated with fever   Sudden headache associated with stiff neck   Slurred speech   Recurring  headache in children   Ongoing numbness or muscle weakness   Loss of vision   Pain following a head injury   Convulsions, or a change in mental awareness   A headache you  would call "the worst headache you've ever had"   New headaches in a pregnant woman   [employer] last reviewed this educational content on 10/27/2016   2000-2021 [employer]. All rights reserved. This information is not intended as a substitute for professional medical care. Always follow your healthcare professional's instructions.    Portions of this note may have been dictated using voice recognition software.  Variances or errors in spelling and vocabulary are possible and unintentional.  Not all errors may have been identified and corrected.  Please notify the [facility] if any discrepancies are noted, or if the meaning of any statement is unclear.    MDM   Please see encounter summary for additional information to support billing. Activities included all or some of the following: preparing to see the patient (e.g. review of tests), obtaining and/or reviewing a separately obtained history, performing a medically appropriate exam and/or evaluation, counseling and educating the patient/family/caregiver, ordering medications, tests, or procedures, referring and communicating with other health care professionals (when not separately reported), documenting clinical information in the electronic or other health record, independently interpreting results (not separately reported) and communicating results to the patient/family/caregiver, and/or care coordination (not separately reported).     Orland Decasper, Gina Marie, NP    [facility]  Electronically signed: 02/19/2021 2:34 PM   Electronically signed by Orland Decasper, Gina Marie, NP at 02/19/2021  2:41 PM EDT

## 2022-12-27 ENCOUNTER — Encounter

## 2023-01-28 ENCOUNTER — Inpatient Hospital Stay
Admit: 2023-01-28 | Discharge: 2023-01-28 | Disposition: A | Discharge status: Home / Self Care | Payer: BLUE CROSS/BLUE SHIELD | Attending: Emergency Medicine

## 2023-01-28 DIAGNOSIS — S29011A Strain of muscle and tendon of front wall of thorax, initial encounter: Secondary | ICD-10-CM

## 2023-01-28 MED ORDER — IBUPROFEN 800 MG PO TABS
800 | ORAL | Status: AC
Start: 2023-01-28 — End: 2023-01-28
  Administered 2023-01-28: 22:00:00 800 mg via ORAL

## 2023-01-28 MED FILL — IBUPROFEN 800 MG PO TABS: 800 MG | ORAL | Qty: 1

## 2023-01-28 NOTE — ED Notes (Signed)
In to dc pt- pt requesting EKG- Will talk to MD

## 2023-01-28 NOTE — ED Triage Notes (Signed)
Pt reports left shoulder stiffness following sharp spasm while pushing shopping cart. Pt denies pain currently.

## 2023-01-28 NOTE — ED Provider Notes (Signed)
[facility]  EMERGENCY DEPARTMENT HISTORY AND PHYSICAL EXAM      Date: 01/28/2023  Patient Name: Frank Arnold  MRN: [id number]  Birthdate: 1990/11/09  Date of evaluation: 01/28/2023  Provider: Domingo DimesSherron Benn-Thompson, MD   Note Started: 6:15 PM EDT 01/28/23    HISTORY OF PRESENT ILLNESS     Chief Complaint   Patient presents with    Shoulder Pain       History Provided By: Patient    HPI: Frank PaulsCameron Arnold is a 32 y.o. male     PAST MEDICAL HISTORY   Past Medical History:  No past medical history on file.    Past Surgical History:  No past surgical history on file.    Family History:  No family history on file.    Social History:       Allergies:  Not on File    PCP: Yevette EdwardsStorey, Cassandra, PA-C    Current Meds:   Current Facility-Administered Medications   Medication Dose Route Frequency Provider Last Rate Last Admin    ibuprofen (ADVIL;MOTRIN) tablet 800 mg  800 mg Oral NOW Benn-Thompson, Iasha Mccalister, MD         No current outpatient medications on file.       Social Determinants of Health:   Social Determinants of Health     Tobacco Use: Not on file   Alcohol Use: Not on file   Financial Resource Strain: Not on file   Food Insecurity: Not on file   Transportation Needs: Not on file   Physical Activity: Not on file   Stress: Not on file   Social Connections: Not on file   Intimate Partner Violence: Not on file   Depression: Not on file   Housing Stability: Not on file   Interpersonal Safety: Not on file   Utilities: Not on file       PHYSICAL EXAM   Physical Exam  Vitals and nursing note reviewed.   Constitutional:       General: He is not in acute distress.     Appearance: He is normal weight. He is not ill-appearing, toxic-appearing or diaphoretic.   Cardiovascular:      Rate and Rhythm: Normal rate and regular rhythm.      Pulses: Normal pulses.      Heart sounds: Normal heart sounds.   Pulmonary:      Effort: Pulmonary effort is normal.      Breath sounds: Normal breath sounds.   Chest:      Chest wall: Tenderness and crepitus  present. No deformity or swelling.   Abdominal:      Palpations: Abdomen is soft.      Tenderness: There is no abdominal tenderness.   Neurological:      Mental Status: He is alert.           SCREENINGS              LAB, EKG AND DIAGNOSTIC RESULTS   Labs:  No results found for this or any previous visit (from the past 12 hour(s)).    EKG: As interpreted by me shows sinus rhythm rate 72, non-STEMI    Radiologic Studies:  Non-plain film images such as CT, Ultrasound and MRI are read by the radiologist. Plain radiographic images are visualized and preliminarily interpreted by the ED Physician with the following findings: Not Applicable.    Interpretation per the Radiologist below, if available at the time of this note:  No orders to display  ED COURSE and DIFFERENTIAL DIAGNOSIS/MDM   CC/HPI Summary, DDx, ED Course, and Reassessment: 32 year old male presents complaint of left shoulder pain, patient states he was leaning bending over a cart when he felt the pain in his left anterior chest radiating to his left shoulder, patient states the pain is worsened with left arm movement, patient denies becoming diaphoretic denies dizziness denies any shortness of breath denies any chest pain    Records Reviewed (source and summary of external notes): Prior medical records and Nursing notes    Vitals:    Vitals:    01/28/23 1804   BP: (!) 162/90   Pulse: 75   Resp: 19   Temp: 98.3 F (36.8 C)   SpO2: 98%        ED COURSE  Motrin p.o.  EKG       Disposition Considerations (Tests not done, Shared Decision Making, Pt Expectation of Test or Treatment.):  Consider transfer chest x-ray and cardiac screening but patient stated exam that the pain elicited with range of motion of patient's left arm is the same pain that he felt when he was leaning over an object and felt the pain in his left shoulder    Patient was given the following medications:  Medications   ibuprofen (ADVIL;MOTRIN) tablet 800 mg (has no administration in time  range)       CONSULTS: (Who and What was discussed)  None     Social Determinants affecting Dx or Tx: None    Smoking Cessation: Not Applicable    PROCEDURES   Unless otherwise noted above, none.  Procedures      CRITICAL CARE TIME   Patient does not meet Critical Care Time, 0 minutes    FINAL IMPRESSION   No diagnosis found.      DISPOSITION/PLAN   DISPOSITION      Discharge Note: The patient is stable for discharge home. The signs, symptoms, diagnosis, and discharge instructions have been discussed, understanding conveyed, and agreed upon. The patient is to follow up as recommended or return to ER should their symptoms worsen.      PATIENT REFERRED TO:  No follow-up provider specified.      DISCHARGE MEDICATIONS:     Medication List      You have not been prescribed any medications.           DISCONTINUED MEDICATIONS:  There are no discharge medications for this patient.      I am the Primary Clinician of Record: Domingo Dimes, MD (electronically signed)    (Please note that parts of this dictation were completed with voice recognition software. Quite often unanticipated grammatical, syntax, homophones, and other interpretive errors are inadvertently transcribed by the computer software. Please disregards these errors. Please excuse any errors that have escaped final proofreading.)     Domingo Dimes, MD  01/28/23 1836

## 2023-01-30 LAB — EKG 12-LEAD
Atrial Rate: 70 {beats}/min
P Axis: 48 degrees
P-R Interval: 155 ms
Q-T Interval: 400 ms
QRS Duration: 103 ms
QTc Calculation (Bazett): 438 ms
R Axis: 34 degrees
T Axis: 29 degrees
Ventricular Rate: 72 {beats}/min

## 2024-02-26 ENCOUNTER — Encounter: Payer: BLUE CROSS/BLUE SHIELD | Attending: Gastroenterology | Primary: Physician Assistant

## 2024-05-31 NOTE — Progress Notes (Signed)
 [facility]      Frank Arnold  MEDICAL RECORD NUMBER:  [id number]  AGE: 33 y.o. 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     Plan:     Patients presents today for a first post-operative evaluation after undergoing right foot ORIF navicular fracture    Patient is improving well. The incision is healing well, advised the use of antibiotic cream over the incision site. He can bear weight on the foot. Patient can start weightbearing with the CAM boot. Outpatient therapy ordered.        Follow up: 4-6 weeks    Procedures        Chief Complaint   Patient presents with   . Right Foot - Post-op     It has been pretty decent.         HISTORY of PRESENT ILLNESS HPI    Frank Arnold is a 33 y.o. male who presents today for a first post-operative evaluation after undergoing right foot ORIF navicular on 05/02/2024. Patient is doing well today. Reports his pain is improved than before surgery. Reports that when he bears weight on the foot there is a sensation of pins in the foot.     No fever, chills, drainage from the incisions.       PAST MEDICAL HISTORY        Diagnosis Date   . Fracture, foot    . Hypertension         PAST SURGICAL HISTORY    Past Surgical History:   Procedure Laterality Date   . CORNEAL TRANSPLANT Left    . CORNEAL TRANSPLANT Right        FAMILY HISTORY    Family History   Problem Relation Name Age of Onset   . No Known Problems Mother     . Thyroid disease Father     . Heart disease Brother  45 - 52       SOCIAL HISTORY    Social History     Tobacco Use   . Smoking status: Never   . Smokeless tobacco:  Never   Vaping Use   . Vaping status: Never Used   Substance Use Topics   . Alcohol use: Never   . Drug use: Never       ALLERGIES    No Known Allergies    MEDICATIONS    Current Outpatient Medications on File Prior to Visit   Medication Sig Dispense Refill   . amLODIPine (Norvasc) 10 MG tablet Take 10 mg by mouth daily.     . [EXPIRED] aspirin 81 MG EC tablet Take 1 tablet by mouth 2 times daily. For DVT prophylaxis 60 tablet 0   . cetirizine (ZyrTEC) 10 MG tablet Take 10 mg by mouth daily.     SABRA omeprazole (PriLOSEC) 20 MG DR capsule Take 20 mg by mouth daily.     . [EXPIRED] oxyCODONE (Roxicodone) 5 MG immediate release tablet Take 1 tablet by mouth every  6 hours as needed for moderate pain or severe pain (as needed for pain) for up to 7 days. Supervising Physician Lari Hollow MD 28 tablet 0   . methylPREDNISolone (Medrol Dospak) 4 MG tablets Use as directed by package instructions.  Complete this medication 1st (Patient not taking: Reported on 05/31/2024) 21 tablet 0     No current facility-administered medications on file prior to visit.       REVIEW OF SYSTEMS    Review of Systems   Constitutional:  No fever, No chills, No sweats, No weakness, No fatigue, No decreased activity.    Eye:  No recent visual problem, No blurring, No double vision, No visual disturbances.    Ear/Nose/Mouth/Throat:  No decreased hearing, No ear pain, No nasal congestion, No sore throat.    Respiratory:  No shortness of breath, No cough, No sputum production, No wheezing.    Cardiovascular:  No chest pain, No palpitations.    Gastrointestinal:  No nausea, No vomiting, No diarrhea, No constipation, No abdominal pain.    Hematology/Lymphatics:  No bruise.    Musculoskeletal:  Joint pain, Muscle pain, No back pain, decreased range of motion  Integumentary:  No rash, No skin lesion.    Neurologic:  No numbness, No tingling, No headache.    Psychiatric:  No anxiety, No depression.     Objective:     Visit Vitals  BP 133/74 (BP Location:  Right arm, Patient Position: Sitting, BP Cuff Size: Large adult)   Pulse 88   Smoking Status Never       Wt Readings from Last 3 Encounters:   01/29/24 130.6 kg   06/26/23 119.7 kg   01/22/23 124.7 kg       PHYSICAL EXAM    General:  No acute distress, Pleasant, cooperative.    Eye:  Extraocular movements are intact, Normal conjunctiva.    HENT:  Normocephalic, atraumatic, atraumatic.    Cardiovascular:  No edema, Peripheral pulses palpable. .    Musculoskeletal:  Examination of the right foot shows well healing wounds without signs of infection.   Integumentary:  Warm, Dry, No rash.    Neurologic:  Normal sensory, Normal motor function, alert and oriented to person, place and time. SABRA    Psychiatric:  Appropriate mood & affect, Normal judgment.          By signing my name below I, Jayson Porch, attest that this documentation has been prepared under the direction and in the presence of Karanvir Prakash M.D.    Electronically signed, Jayson Porch, Medical Scribe.      I have reviewed and verified the scribed note for this patient's visit as recorded by the scribe  Signed,  Karanvir Prakash

## 2024-06-14 ENCOUNTER — Ambulatory Visit
Admit: 2024-06-14 | Discharge: 2024-06-14 | Payer: BLUE CROSS/BLUE SHIELD | Attending: Gastroenterology | Primary: Physician Assistant

## 2024-06-14 MED ORDER — PANTOPRAZOLE SODIUM 40 MG PO TBEC
40 | ORAL_TABLET | Freq: Every day | ORAL | 5 refills | 30.00000 days | Status: AC
Start: 2024-06-14 — End: ?

## 2024-06-14 NOTE — Progress Notes (Unsigned)
 Chief Complaint   Patient presents with    New Patient     Referred per DOROTHA Portugal, NP but then she left [facility] and now patient sees C. Story, NP in [place], [place].    Gastroesophageal Reflux     States has had this for a long time.     BP 122/77 (BP Site: Right Upper Arm, Patient Position: Sitting, BP Cuff Size: Large Adult)   Pulse 86   Temp 97.5 F (36.4 C) (Oral)   Resp 21   Ht 1.702 m (5' 7)   Wt 126.3 kg (278 lb 8 oz)   SpO2 95%   BMI 43.62 kg/m

## 2024-09-14 ENCOUNTER — Encounter: Payer: BLUE CROSS/BLUE SHIELD | Attending: Gastroenterology | Primary: Physician Assistant
